# Patient Record
Sex: Male | Born: 1945 | Race: Black or African American | Hispanic: No | Marital: Married | Smoking: Never smoker
Health system: Southern US, Community
[De-identification: ages and names within clinical notes are randomized; demographics above are authoritative.]

## PROBLEM LIST (undated history)

## (undated) DIAGNOSIS — I10 Essential (primary) hypertension: Secondary | ICD-10-CM

## (undated) DIAGNOSIS — I4891 Unspecified atrial fibrillation: Secondary | ICD-10-CM

---

## 2011-05-30 ENCOUNTER — Encounter: Payer: Self-pay | Admitting: *Deleted

## 2011-05-30 ENCOUNTER — Emergency Department (INDEPENDENT_AMBULATORY_CARE_PROVIDER_SITE_OTHER): Payer: Medicare Other

## 2011-05-30 ENCOUNTER — Emergency Department (HOSPITAL_BASED_OUTPATIENT_CLINIC_OR_DEPARTMENT_OTHER)
Admission: EM | Admit: 2011-05-30 | Discharge: 2011-05-30 | Disposition: A | Discharge status: Home / Self Care | Payer: Medicare Other | Attending: Emergency Medicine | Admitting: Emergency Medicine

## 2011-05-30 DIAGNOSIS — J069 Acute upper respiratory infection, unspecified: Secondary | ICD-10-CM

## 2011-05-30 DIAGNOSIS — I4891 Unspecified atrial fibrillation: Secondary | ICD-10-CM | POA: Insufficient documentation

## 2011-05-30 DIAGNOSIS — R059 Cough, unspecified: Secondary | ICD-10-CM

## 2011-05-30 DIAGNOSIS — I10 Essential (primary) hypertension: Secondary | ICD-10-CM | POA: Insufficient documentation

## 2011-05-30 DIAGNOSIS — R05 Cough: Secondary | ICD-10-CM | POA: Insufficient documentation

## 2011-05-30 HISTORY — DX: Unspecified atrial fibrillation: I48.91

## 2011-05-30 HISTORY — DX: Essential (primary) hypertension: I10

## 2011-05-30 MED ORDER — ACETAMINOPHEN-CODEINE 120-12 MG/5ML PO SUSP: 5.0000 mL | Freq: Four times a day (QID) | ORAL | Status: AC | PRN

## 2011-05-30 NOTE — ED Notes (Signed)
Pt states he has had a NP cough for 3 days. Sore throat.

## 2011-05-30 NOTE — ED Provider Notes (Signed)
History  This chart was scribed for Sunnie Nielsen, MD by Bennett Scrape. This patient was seen in room MH01/MH01 and the patient's care was started at 7:34PM.  CSN: [id number] Arrival date & time: 05/30/2011  7:15 PM   First MD Initiated Contact with Patient 05/30/11 1932      Chief Complaint  Patient presents with  . Cough     Patient is a 65 y.o. male presenting with cough. The history is provided by the patient. No language interpreter was used.  Cough The current episode started more than 2 days ago. The problem has been gradually worsening. The cough is non-productive. There has been no fever. Associated symptoms include sore throat. Pertinent negatives include no chest pain, no chills and no shortness of breath. He has tried cough syrup for the symptoms. The treatment provided mild relief. He is not a smoker. His past medical history does not include COPD or asthma.   Jermaine Maldonado is a 65 y.o. male who presents to the Emergency Department complaining of 5 days of a non-productive, gradually worsening cough with associated sore throat. Pt states that he took robutussin with little improvement. Pt denies sick contact at home. Pt denies fever, chills, sinus congestion, abdominal pain, SOB, chest pain and leg pain and swelling as associated symptoms. Pt has a h/o HTN, but denies h/o diabetes, asthma and  COPD.  Pt's PCP is Dr. Alvina Filbert at [facility].  Past Medical History  Diagnosis Date  . Hypertension   . Atrial fibrillation     History reviewed. No pertinent past surgical history.  History reviewed. No pertinent family history.  History  Substance Use Topics  . Smoking status: Never Smoker   . Smokeless tobacco: Not on file  . Alcohol Use: No      Review of Systems  Constitutional: Negative for fever and chills.  HENT: Positive for sore throat. Negative for congestion.   Respiratory: Positive for cough. Negative for shortness of breath.     Cardiovascular: Negative for chest pain and leg swelling.  Gastrointestinal: Negative for abdominal pain.    Allergies  Review of patient's allergies indicates no known allergies.  Home Medications   Current Outpatient Rx  Name Route Sig Dispense Refill  . DILTIAZEM HCL 90 MG PO TABS Oral Take 90 mg by mouth 4 (four) times daily.      Marland Kitchen LISINOPRIL 20 MG PO TABS Oral Take 20 mg by mouth daily.      . WARFARIN SODIUM 5 MG PO TABS Oral Take 7.5-10 mg by mouth daily. Take 1 & 1/2 tabs on Monday, Wednesday, Friday and Sunday. Take 2 tabs on Tuesday, Thursday and Saturday.       Triage Vitals: BP 150/84  Pulse 62  Temp(Src) 98.1 F (36.7 C) (Oral)  Resp 20  Ht 5\' 6"  (1.676 m)  Wt 190 lb (86.183 kg)  BMI 30.67 kg/m2  SpO2 99%  Physical Exam  Nursing note and vitals reviewed. Constitutional: He is oriented to person, place, and time. He appears well-developed and well-nourished.  HENT:  Head: Normocephalic and atraumatic.  Eyes: EOM are normal.  Neck: Neck supple.  Cardiovascular: Normal rate and regular rhythm.   Pulmonary/Chest: Effort normal and breath sounds normal. He has no wheezes. He has no rales.  Abdominal: Soft. There is no tenderness.  Musculoskeletal: Normal range of motion.  Neurological: He is alert and oriented to person, place, and time.  Skin: Skin is warm and dry.  Psychiatric: He  has a normal mood and affect. His behavior is normal.    ED Course  Procedures (including critical care time)  DIAGNOSTIC STUDIES: Oxygen Saturation is 99% on room air, normal by my interpretation.    COORDINATION OF CARE: 7:37PM-Discussed treatment plan with patient at bedside and patient agreed to plan.   Labs Reviewed - No data to display Dg Chest 2 View  05/30/2011  *RADIOLOGY REPORT*  Clinical Data: Cough.  CHEST - 2 VIEW  Comparison: None.  Findings:  Cardiopericardial silhouette within normal limits. Mediastinal contours normal. Trachea midline.  No airspace disease  or effusion.  Bilateral AC joint osteoarthritis.  IMPRESSION: No active cardiopulmonary disease.  Original Report Authenticated By: Andreas Newport, M.D.       MDM   Dry cough and sore throat and clinical presentation consistent with viral URI. Normal pulmonary exam and patient appears well otherwise. Is on lisinopril, and patient aware this could be etiology for cough does not explain associated sore throat. Stable for discharge home with antitussive and plan) care followup for review of medications.   I personally performed the services described in this documentation, which was scribed in my presence. The recorded information has been reviewed and considered.      Sunnie Nielsen, MD 05/30/11 2121

## 2012-12-07 ENCOUNTER — Encounter (HOSPITAL_BASED_OUTPATIENT_CLINIC_OR_DEPARTMENT_OTHER): Payer: Self-pay

## 2012-12-07 ENCOUNTER — Emergency Department (HOSPITAL_BASED_OUTPATIENT_CLINIC_OR_DEPARTMENT_OTHER)
Admission: EM | Admit: 2012-12-07 | Discharge: 2012-12-07 | Disposition: A | Discharge status: Home / Self Care | Payer: Medicare Other | Attending: Emergency Medicine | Admitting: Emergency Medicine

## 2012-12-07 DIAGNOSIS — H539 Unspecified visual disturbance: Secondary | ICD-10-CM | POA: Insufficient documentation

## 2012-12-07 DIAGNOSIS — I4891 Unspecified atrial fibrillation: Secondary | ICD-10-CM | POA: Insufficient documentation

## 2012-12-07 DIAGNOSIS — I10 Essential (primary) hypertension: Secondary | ICD-10-CM | POA: Insufficient documentation

## 2012-12-07 DIAGNOSIS — Z79899 Other long term (current) drug therapy: Secondary | ICD-10-CM | POA: Insufficient documentation

## 2012-12-07 DIAGNOSIS — Z7901 Long term (current) use of anticoagulants: Secondary | ICD-10-CM | POA: Insufficient documentation

## 2012-12-07 MED ORDER — TETRACAINE HCL 0.5 % OP SOLN
OPHTHALMIC | Status: AC
  Filled 2012-12-07: qty 2

## 2012-12-07 MED ORDER — FLUORESCEIN SODIUM 1 MG OP STRP
ORAL_STRIP | OPHTHALMIC | Status: AC
  Filled 2012-12-07: qty 1

## 2012-12-07 NOTE — ED Provider Notes (Signed)
History     CSN: [id number]  Arrival date & time 12/07/12  1024   First MD Initiated Contact with Patient 12/07/12 1044      Chief Complaint  Patient presents with  . Eye Problem    (Consider location/radiation/quality/duration/timing/severity/associated sxs/prior treatment) HPI Complains of floaters, at right I onset last night. No eye pain no foreign body sensation no other complaint nothing makes symptoms but are worse no treatment prior to coming here Past Medical History  Diagnosis Date  . Hypertension   . Atrial fibrillation     History reviewed. No pertinent past surgical history.  No family history on file.  History  Substance Use Topics  . Smoking status: Never Smoker   . Smokeless tobacco: Not on file  . Alcohol Use: No   No tobacco no alcohol no drugs   Review of Systems  Eyes: Positive for visual disturbance. Negative for pain, discharge, redness and itching.  Neurological: Negative.     Allergies  Review of patient's allergies indicates no known allergies.  Home Medications   Current Outpatient Rx  Name  Route  Sig  Dispense  Refill  . diltiazem (CARDIZEM) 90 MG tablet   Oral   Take 90 mg by mouth 4 (four) times daily.           Marland Kitchen lisinopril (PRINIVIL,ZESTRIL) 20 MG tablet   Oral   Take 20 mg by mouth daily.           Marland Kitchen warfarin (COUMADIN) 5 MG tablet   Oral   Take 7.5-10 mg by mouth daily. Take 1 & 1/2 tabs on Monday, Wednesday, Friday and Sunday. Take 2 tabs on Tuesday, Thursday and Saturday.            BP 138/82  Pulse 55  Temp(Src) 98 F (36.7 C) (Oral)  Resp 16  Ht 5\' 7"  (1.702 m)  Wt 185 lb (83.915 kg)  BMI 28.97 kg/m2  SpO2 100%  Physical Exam  Nursing note and vitals reviewed. Constitutional: He is oriented to person, place, and time. He appears well-developed and well-nourished.  HENT:  Head: Normocephalic and atraumatic.  Eyes: Conjunctivae are normal. Pupils are equal, round, and reactive to light. Right eye  exhibits no discharge. Left eye exhibits no discharge. No scleral icterus.  Fundi not well visualized  Cardiovascular: Regular rhythm.   Neurological: He is alert and oriented to person, place, and time. A cranial nerve deficit is present. Coordination normal.   visual acuity noted  ED Course  Procedures (including critical care time)  Labs Reviewed - No data to display No results found.   No diagnosis found.    MDM  I am concerned about possible detached retina Case discussed with Dr.Lyles. An appointment has been scheduled at his office for 1:15 PM today Diagnosis scotomata of right eye        Doug Sou, MD 12/07/12 1104

## 2012-12-07 NOTE — ED Notes (Signed)
Pt reports he feels like something is in his right eye.  Denies injury and onset was last pm.

## 2013-06-08 ENCOUNTER — Other Ambulatory Visit (HOSPITAL_COMMUNITY): Payer: Self-pay | Admitting: Sports Medicine

## 2013-06-08 DIAGNOSIS — M5126 Other intervertebral disc displacement, lumbar region: Secondary | ICD-10-CM

## 2013-06-10 ENCOUNTER — Encounter (HOSPITAL_BASED_OUTPATIENT_CLINIC_OR_DEPARTMENT_OTHER): Payer: Self-pay | Admitting: Emergency Medicine

## 2013-06-10 ENCOUNTER — Emergency Department (HOSPITAL_BASED_OUTPATIENT_CLINIC_OR_DEPARTMENT_OTHER)
Admission: EM | Admit: 2013-06-10 | Discharge: 2013-06-10 | Disposition: A | Discharge status: Home / Self Care | Payer: Managed Care, Other (non HMO) | Attending: Emergency Medicine | Admitting: Emergency Medicine

## 2013-06-10 DIAGNOSIS — Z7901 Long term (current) use of anticoagulants: Secondary | ICD-10-CM | POA: Insufficient documentation

## 2013-06-10 DIAGNOSIS — K529 Noninfective gastroenteritis and colitis, unspecified: Secondary | ICD-10-CM

## 2013-06-10 DIAGNOSIS — Z79899 Other long term (current) drug therapy: Secondary | ICD-10-CM | POA: Insufficient documentation

## 2013-06-10 DIAGNOSIS — K5289 Other specified noninfective gastroenteritis and colitis: Secondary | ICD-10-CM | POA: Insufficient documentation

## 2013-06-10 DIAGNOSIS — I4891 Unspecified atrial fibrillation: Secondary | ICD-10-CM | POA: Insufficient documentation

## 2013-06-10 DIAGNOSIS — I10 Essential (primary) hypertension: Secondary | ICD-10-CM | POA: Insufficient documentation

## 2013-06-10 DIAGNOSIS — R6883 Chills (without fever): Secondary | ICD-10-CM | POA: Insufficient documentation

## 2013-06-10 LAB — CBC WITH DIFFERENTIAL/PLATELET
Eosinophils Relative: 1 % (ref 0–5)
HCT: 46.4 % (ref 39.0–52.0)
Hemoglobin: 15.6 g/dL (ref 13.0–17.0)
Lymphocytes Relative: 4 % — ABNORMAL LOW (ref 12–46)
Lymphs Abs: 0.3 10*3/uL — ABNORMAL LOW (ref 0.7–4.0)
MCV: 82 fL (ref 78.0–100.0)
Monocytes Absolute: 0.4 10*3/uL (ref 0.1–1.0)
Neutro Abs: 6.4 10*3/uL (ref 1.7–7.7)
RBC: 5.66 MIL/uL (ref 4.22–5.81)
WBC: 7.2 10*3/uL (ref 4.0–10.5)

## 2013-06-10 LAB — COMPREHENSIVE METABOLIC PANEL
ALT: 35 U/L (ref 0–53)
CO2: 25 mEq/L (ref 19–32)
Calcium: 10 mg/dL (ref 8.4–10.5)
Chloride: 100 mEq/L (ref 96–112)
Creatinine, Ser: 1.3 mg/dL (ref 0.50–1.35)
GFR calc Af Amer: 64 mL/min — ABNORMAL LOW (ref >90)
GFR calc non Af Amer: 55 mL/min — ABNORMAL LOW (ref >90)
Glucose, Bld: 114 mg/dL — ABNORMAL HIGH (ref 70–99)
Total Bilirubin: 0.4 mg/dL (ref 0.3–1.2)

## 2013-06-10 MED ORDER — SODIUM CHLORIDE 0.9 % IV SOLN
Freq: Once | INTRAVENOUS | Status: AC
  Administered 2013-06-10: 12:00:00 via INTRAVENOUS

## 2013-06-10 MED ORDER — PROMETHAZINE HCL 25 MG PO TABS: 25.0000 mg | ORAL_TABLET | Freq: Four times a day (QID) | ORAL | Status: AC | PRN

## 2013-06-10 MED ORDER — ONDANSETRON HCL 4 MG/2ML IJ SOLN
4.0000 mg | Freq: Once | INTRAMUSCULAR | Status: AC
  Administered 2013-06-10: 4 mg via INTRAVENOUS
  Filled 2013-06-10: qty 2

## 2013-06-10 NOTE — ED Notes (Signed)
Patient here for evaluation following abdominal cramping with vomiting and diarrhea that started last pm at 2300. Patient reports that he has vomited 6 times and multiple episodes of diarrhea; no vomiting x 2 hours. Denies any abdominal pain.

## 2013-06-10 NOTE — Discharge Instructions (Signed)
Phenergan as prescribed as needed for nausea.  Return to the emergency department if you develop severe abdominal pain, bloody stool, or no urine output in 12 hours.   Viral Gastroenteritis Viral gastroenteritis is also known as stomach flu. This condition affects the stomach and intestinal tract. It can cause sudden diarrhea and vomiting. The illness typically lasts 3 to 8 days. Most people develop an immune response that eventually gets rid of the virus. While this natural response develops, the virus can make you quite ill. CAUSES  Many different viruses can cause gastroenteritis, such as rotavirus or noroviruses. You can catch one of these viruses by consuming contaminated food or water. You may also catch a virus by sharing utensils or other personal items with an infected person or by touching a contaminated surface. SYMPTOMS  The most common symptoms are diarrhea and vomiting. These problems can cause a severe loss of body fluids (dehydration) and a body salt (electrolyte) imbalance. Other symptoms may include:  Fever.  Headache.  Fatigue.  Abdominal pain. DIAGNOSIS  Your caregiver can usually diagnose viral gastroenteritis based on your symptoms and a physical exam. A stool sample may also be taken to test for the presence of viruses or other infections. TREATMENT  This illness typically goes away on its own. Treatments are aimed at rehydration. The most serious cases of viral gastroenteritis involve vomiting so severely that you are not able to keep fluids down. In these cases, fluids must be given through an intravenous line (IV). HOME CARE INSTRUCTIONS   Drink enough fluids to keep your urine clear or pale yellow. Drink small amounts of fluids frequently and increase the amounts as tolerated.  Ask your caregiver for specific rehydration instructions.  Avoid:  Foods high in sugar.  Alcohol.  Carbonated drinks.  Tobacco.  Juice.  Caffeine drinks.  Extremely hot or  cold fluids.  Fatty, greasy foods.  Too much intake of anything at one time.  Dairy products until 24 to 48 hours after diarrhea stops.  You may consume probiotics. Probiotics are active cultures of beneficial bacteria. They may lessen the amount and number of diarrheal stools in adults. Probiotics can be found in yogurt with active cultures and in supplements.  Wash your hands well to avoid spreading the virus.  Only take over-the-counter or prescription medicines for pain, discomfort, or fever as directed by your caregiver. Do not give aspirin to children. Antidiarrheal medicines are not recommended.  Ask your caregiver if you should continue to take your regular prescribed and over-the-counter medicines.  Keep all follow-up appointments as directed by your caregiver. SEEK IMMEDIATE MEDICAL CARE IF:   You are unable to keep fluids down.  You do not urinate at least once every 6 to 8 hours.  You develop shortness of breath.  You notice blood in your stool or vomit. This may look like coffee grounds.  You have abdominal pain that increases or is concentrated in one small area (localized).  You have persistent vomiting or diarrhea.  You have a fever.  The patient is a child younger than 3 months, and he or she has a fever.  The patient is a child older than 3 months, and he or she has a fever and persistent symptoms.  The patient is a child older than 3 months, and he or she has a fever and symptoms suddenly get worse.  The patient is a baby, and he or she has no tears when crying. MAKE SURE YOU:   Understand these  instructions.  Will watch your condition.  Will get help right away if you are not doing well or get worse. Document Released: 06/21/2005 Document Revised: 09/13/2011 Document Reviewed: 04/07/2011 [facility] Patient Information 2014 [place].

## 2013-06-10 NOTE — ED Provider Notes (Signed)
CSN: [id number]     Arrival date & time 06/10/13  1034 History   First MD Initiated Contact with Patient 06/10/13 1118     Chief Complaint  Patient presents with  . Emesis   (Consider location/radiation/quality/duration/timing/severity/associated sxs/prior Treatment) HPI Comments: Patient is a 67 year old male history of atrial fibrillation. He presents with a 12 hour history of nausea vomiting and diarrhea. All of this has been nonbloody. He has felt chilled but denies having taken his temperature. He denies any ill contacts.  Patient is a 67 y.o. male presenting with vomiting. The history is provided by the patient.  Emesis Severity:  Moderate Duration:  12 hours Timing:  Constant Progression:  Worsening Chronicity:  New Relieved by:  Nothing Worsened by:  Nothing tried Ineffective treatments:  None tried Associated symptoms: chills and diarrhea   Associated symptoms: no abdominal pain and no fever     Past Medical History  Diagnosis Date  . Hypertension   . Atrial fibrillation    History reviewed. No pertinent past surgical history. No family history on file. History  Substance Use Topics  . Smoking status: Never Smoker   . Smokeless tobacco: Not on file  . Alcohol Use: No    Review of Systems  Constitutional: Positive for chills.  Gastrointestinal: Positive for vomiting and diarrhea. Negative for abdominal pain.  All other systems reviewed and are negative.    Allergies  Review of patient's allergies indicates no known allergies.  Home Medications   Current Outpatient Rx  Name  Route  Sig  Dispense  Refill  . diltiazem (CARDIZEM) 90 MG tablet   Oral   Take 90 mg by mouth 4 (four) times daily.           Marland Kitchen lisinopril (PRINIVIL,ZESTRIL) 20 MG tablet   Oral   Take 20 mg by mouth daily.           Marland Kitchen warfarin (COUMADIN) 5 MG tablet   Oral   Take 7.5-10 mg by mouth daily. Take 1 & 1/2 tabs on Monday, Wednesday, Friday and Sunday. Take 2 tabs on Tuesday,  Thursday and Saturday.           BP 116/77  Pulse 114  Temp(Src) 98.7 F (37.1 C) (Oral)  Resp 18  SpO2 100% Physical Exam  Nursing note and vitals reviewed. Constitutional: He is oriented to person, place, and time. He appears well-developed and well-nourished. No distress.  HENT:  Head: Normocephalic and atraumatic.  Mouth/Throat: Oropharynx is clear and moist.  Neck: Normal range of motion. Neck supple.  Cardiovascular: Normal rate, regular rhythm and normal heart sounds.   No murmur heard. Pulmonary/Chest: Effort normal and breath sounds normal. No respiratory distress. He has no wheezes.  Abdominal: Soft. Bowel sounds are normal. He exhibits no distension. There is no tenderness.  Musculoskeletal: Normal range of motion. He exhibits no edema.  Lymphadenopathy:    He has no cervical adenopathy.  Neurological: He is alert and oriented to person, place, and time.  Skin: Skin is warm and dry. No rash noted. He is not diaphoretic.    ED Course  Procedures (including critical care time) Labs Review Labs Reviewed  CBC WITH DIFFERENTIAL  COMPREHENSIVE METABOLIC PANEL   Imaging Review No results found.    MDM  No diagnosis found. Patient is a 67 year old male presents with complaints of nausea vomiting and diarrhea. His presentation, exam, and workup are all consistent with a viral gastroenteritis. He was given IV fluids and Zofran and is  now feeling better. His laboratory studies are unremarkable and I feel as though he is stable for discharge with when necessary followup.    Geoffery Lyons, MD 06/10/13 1331

## 2013-06-18 ENCOUNTER — Ambulatory Visit (HOSPITAL_COMMUNITY)
Admission: RE | Admit: 2013-06-18 | Discharge: 2013-06-18 | Disposition: A | Discharge status: Home / Self Care | Payer: Managed Care, Other (non HMO) | Source: Ambulatory Visit | Attending: Sports Medicine | Admitting: Sports Medicine

## 2013-06-18 DIAGNOSIS — M545 Low back pain, unspecified: Secondary | ICD-10-CM | POA: Insufficient documentation

## 2013-06-18 DIAGNOSIS — M47817 Spondylosis without myelopathy or radiculopathy, lumbosacral region: Secondary | ICD-10-CM | POA: Insufficient documentation

## 2013-06-18 DIAGNOSIS — Q762 Congenital spondylolisthesis: Secondary | ICD-10-CM | POA: Insufficient documentation

## 2013-06-18 DIAGNOSIS — M5126 Other intervertebral disc displacement, lumbar region: Secondary | ICD-10-CM | POA: Insufficient documentation

## 2013-10-17 ENCOUNTER — Emergency Department (HOSPITAL_BASED_OUTPATIENT_CLINIC_OR_DEPARTMENT_OTHER)
Admission: EM | Admit: 2013-10-17 | Discharge: 2013-10-17 | Disposition: A | Discharge status: Home / Self Care | Payer: Managed Care, Other (non HMO) | Attending: Emergency Medicine | Admitting: Emergency Medicine

## 2013-10-17 ENCOUNTER — Emergency Department (HOSPITAL_BASED_OUTPATIENT_CLINIC_OR_DEPARTMENT_OTHER): Payer: Managed Care, Other (non HMO)

## 2013-10-17 ENCOUNTER — Encounter (HOSPITAL_BASED_OUTPATIENT_CLINIC_OR_DEPARTMENT_OTHER): Payer: Self-pay | Admitting: Emergency Medicine

## 2013-10-17 DIAGNOSIS — X500XXA Overexertion from strenuous movement or load, initial encounter: Secondary | ICD-10-CM | POA: Insufficient documentation

## 2013-10-17 DIAGNOSIS — S39012A Strain of muscle, fascia and tendon of lower back, initial encounter: Secondary | ICD-10-CM

## 2013-10-17 DIAGNOSIS — S335XXA Sprain of ligaments of lumbar spine, initial encounter: Secondary | ICD-10-CM | POA: Insufficient documentation

## 2013-10-17 DIAGNOSIS — Z7901 Long term (current) use of anticoagulants: Secondary | ICD-10-CM | POA: Insufficient documentation

## 2013-10-17 DIAGNOSIS — I4891 Unspecified atrial fibrillation: Secondary | ICD-10-CM | POA: Insufficient documentation

## 2013-10-17 DIAGNOSIS — I10 Essential (primary) hypertension: Secondary | ICD-10-CM | POA: Insufficient documentation

## 2013-10-17 DIAGNOSIS — Y9289 Other specified places as the place of occurrence of the external cause: Secondary | ICD-10-CM | POA: Insufficient documentation

## 2013-10-17 DIAGNOSIS — Z79899 Other long term (current) drug therapy: Secondary | ICD-10-CM | POA: Insufficient documentation

## 2013-10-17 DIAGNOSIS — Y93H2 Activity, gardening and landscaping: Secondary | ICD-10-CM | POA: Insufficient documentation

## 2013-10-17 LAB — URINALYSIS, ROUTINE W REFLEX MICROSCOPIC
Bilirubin Urine: NEGATIVE
GLUCOSE, UA: NEGATIVE mg/dL
Hgb urine dipstick: NEGATIVE
KETONES UR: NEGATIVE mg/dL
LEUKOCYTES UA: NEGATIVE
Nitrite: NEGATIVE
PH: 6 (ref 5.0–8.0)
Protein, ur: NEGATIVE mg/dL
Specific Gravity, Urine: 1.021 (ref 1.005–1.030)
Urobilinogen, UA: 0.2 mg/dL (ref 0.0–1.0)

## 2013-10-17 MED ORDER — OXYCODONE-ACETAMINOPHEN 5-325 MG PO TABS
2.0000 | ORAL_TABLET | Freq: Once | ORAL | Status: AC
  Administered 2013-10-17: 2 via ORAL
  Filled 2013-10-17: qty 2

## 2013-10-17 MED ORDER — OXYCODONE-ACETAMINOPHEN 5-325 MG PO TABS: 2.0000 | ORAL_TABLET | ORAL | Status: AC | PRN

## 2013-10-17 NOTE — ED Provider Notes (Signed)
CSN: [id number]     Arrival date & time 10/17/13  1157 History   First MD Initiated Contact with Patient 10/17/13 1205     Chief Complaint  Patient presents with  . Back Pain     (Consider location/radiation/quality/duration/timing/severity/associated sxs/prior Treatment) Patient is a 68 y.o. male presenting with back pain. The history is provided by the patient. No language interpreter was used.  Back Pain Location:  Generalized Quality:  Aching Radiates to:  Does not radiate Pain severity:  Moderate Pain is:  Same all the time Onset quality:  Gradual Timing:  Constant Progression:  Worsening Chronicity:  New Context: lifting heavy objects   Relieved by:  Nothing Worsened by:  Nothing tried Ineffective treatments:  None tried Associated symptoms: no abdominal pain     Past Medical History  Diagnosis Date  . Hypertension   . Atrial fibrillation    History reviewed. No pertinent past surgical history. No family history on file. History  Substance Use Topics  . Smoking status: Never Smoker   . Smokeless tobacco: Not on file  . Alcohol Use: No    Review of Systems  Gastrointestinal: Negative for abdominal pain.  Musculoskeletal: Positive for back pain.  All other systems reviewed and are negative.     Allergies  Review of patient's allergies indicates no known allergies.  Home Medications   Prior to Admission medications   Medication Sig Start Date End Date Taking? Authorizing Provider  HYDRALAZINE-HCTZ PO Take by mouth.   Yes Historical Provider, MD  diltiazem (CARDIZEM) 90 MG tablet Take 90 mg by mouth 4 (four) times daily.      Historical Provider, MD  lisinopril (PRINIVIL,ZESTRIL) 20 MG tablet Take 20 mg by mouth daily.      Historical Provider, MD  promethazine (PHENERGAN) 25 MG tablet Take 1 tablet (25 mg total) by mouth every 6 (six) hours as needed for nausea. 06/10/13   Geoffery Lyonsouglas Delo, MD  warfarin (COUMADIN) 5 MG tablet Take 7.5-10 mg by mouth daily.  Take 1 & 1/2 tabs on Monday, Wednesday, Friday and Sunday. Take 2 tabs on Tuesday, Thursday and Saturday.     Historical Provider, MD   BP 160/75  Pulse 59  Temp(Src) 98.3 F (36.8 C) (Oral)  Resp 16  Ht 5\' 6"  (1.676 m)  Wt 190 lb (86.183 kg)  BMI 30.68 kg/m2  SpO2 96% Physical Exam  Nursing note and vitals reviewed. Constitutional: He is oriented to person, place, and time. He appears well-developed and well-nourished.  HENT:  Head: Normocephalic.  Right Ear: External ear normal.  Left Ear: External ear normal.  Nose: Nose normal.  Mouth/Throat: Oropharynx is clear and moist.  Eyes: EOM are normal. Pupils are equal, round, and reactive to light.  Neck: Normal range of motion.  Cardiovascular: Normal rate and normal heart sounds.   Pulmonary/Chest: Effort normal.  Abdominal: Soft. He exhibits no distension.  Musculoskeletal: Normal range of motion.  Neurological: He is alert and oriented to person, place, and time.  Skin: Skin is warm.  Psychiatric: He has a normal mood and affect.    ED Course  Procedures (including critical care time) Labs Review Labs Reviewed - No data to display  Imaging Review No results found.   EKG Interpretation None      MDM   Final diagnoses:  Lumbar strain    Pt given Percocet for pain     Elson AreasLeslie K Kasara Schomer, PA-C 10/17/13 1352

## 2013-10-17 NOTE — ED Provider Notes (Signed)
Medical screening examination/treatment/procedure(s) were conducted as a shared visit with non-physician practitioner(s) and myself.  I personally evaluated the patient during the encounter.   EKG Interpretation None        Joya Gaskinsonald W Edgard Debord, MD 10/17/13 1416

## 2013-10-17 NOTE — Discharge Instructions (Signed)
Back Pain, Adult Low back pain is very common. About 1 in 5 people have back pain.The cause of low back pain is rarely dangerous. The pain often gets better over time.About half of people with a sudden onset of back pain feel better in just 2 weeks. About 8 in 10 people feel better by 6 weeks.  CAUSES Some common causes of back pain include:  Strain of the muscles or ligaments supporting the spine.  Wear and tear (degeneration) of the spinal discs.  Arthritis.  Direct injury to the back. DIAGNOSIS Most of the time, the direct cause of low back pain is not known.However, back pain can be treated effectively even when the exact cause of the pain is unknown.Answering your caregiver's questions about your overall health and symptoms is one of the most accurate ways to make sure the cause of your pain is not dangerous. If your caregiver needs more information, he or she may order lab work or imaging tests (X-rays or MRIs).However, even if imaging tests show changes in your back, this usually does not require surgery. HOME CARE INSTRUCTIONS For many people, back pain returns.Since low back pain is rarely dangerous, it is often a condition that people can learn to manageon their own.   Remain active. It is stressful on the back to sit or stand in one place. Do not sit, drive, or stand in one place for more than 30 minutes at a time. Take short walks on level surfaces as soon as pain allows.Try to increase the length of time you walk each day.  Do not stay in bed.Resting more than 1 or 2 days can delay your recovery.  Do not avoid exercise or work.Your body is made to move.It is not dangerous to be active, even though your back may hurt.Your back will likely heal faster if you return to being active before your pain is gone.  Pay attention to your body when you bend and lift. Many people have less discomfortwhen lifting if they bend their knees, keep the load close to their bodies,and  avoid twisting. Often, the most comfortable positions are those that put less stress on your recovering back.  Find a comfortable position to sleep. Use a firm mattress and lie on your side with your knees slightly bent. If you lie on your back, put a pillow under your knees.  Only take over-the-counter or prescription medicines as directed by your caregiver. Over-the-counter medicines to reduce pain and inflammation are often the most helpful.Your caregiver may prescribe muscle relaxant drugs.These medicines help dull your pain so you can more quickly return to your normal activities and healthy exercise.  Put ice on the injured area.  Put ice in a plastic bag.  Place a towel between your skin and the bag.  Leave the ice on for 15-20 minutes, 03-04 times a day for the first 2 to 3 days. After that, ice and heat may be alternated to reduce pain and spasms.  Ask your caregiver about trying back exercises and gentle massage. This may be of some benefit.  Avoid feeling anxious or stressed.Stress increases muscle tension and can worsen back pain.It is important to recognize when you are anxious or stressed and learn ways to manage it.Exercise is a great option. SEEK MEDICAL CARE IF:  You have pain that is not relieved with rest or medicine.  You have pain that does not improve in 1 week.  You have new symptoms.  You are generally not feeling well. SEEK   IMMEDIATE MEDICAL CARE IF:   You have pain that radiates from your back into your legs.  You develop new bowel or bladder control problems.  You have unusual weakness or numbness in your arms or legs.  You develop nausea or vomiting.  You develop abdominal pain.  You feel faint. Document Released: 06/21/2005 Document Revised: 12/21/2011 Document Reviewed: 11/09/2010 [employer] Patient Information 2014 [employer].  

## 2013-10-17 NOTE — ED Provider Notes (Signed)
Patient seen/examined in the Emergency Department in conjunction with Midlevel Provider FeltonSofia Patient reports back pain after using lawn mower yesterday Exam : awake/alert, no distress, no focal motor deficits in the lower extremites Plan: stable for d/c home   Jermaine Gaskinsonald W Avryl Roehm, MD 10/17/13 1352

## 2013-10-17 NOTE — ED Notes (Signed)
C/o left lower back pain started yesterday after cranking lawn mower-pain worse with movement-slow steady gait from ED WR to tx area

## 2013-12-25 ENCOUNTER — Ambulatory Visit: Payer: Managed Care, Other (non HMO) | Attending: Family Medicine | Admitting: Physical Therapy

## 2013-12-25 DIAGNOSIS — M545 Low back pain, unspecified: Secondary | ICD-10-CM | POA: Diagnosis not present

## 2013-12-25 DIAGNOSIS — IMO0001 Reserved for inherently not codable concepts without codable children: Secondary | ICD-10-CM | POA: Diagnosis not present

## 2013-12-27 ENCOUNTER — Ambulatory Visit: Payer: Managed Care, Other (non HMO) | Admitting: Physical Therapy

## 2014-01-01 ENCOUNTER — Ambulatory Visit: Payer: Managed Care, Other (non HMO) | Admitting: Physical Therapy

## 2014-01-01 DIAGNOSIS — IMO0001 Reserved for inherently not codable concepts without codable children: Secondary | ICD-10-CM | POA: Diagnosis not present

## 2014-01-03 ENCOUNTER — Ambulatory Visit: Payer: Managed Care, Other (non HMO) | Attending: Family Medicine | Admitting: Physical Therapy

## 2014-01-03 DIAGNOSIS — M545 Low back pain, unspecified: Secondary | ICD-10-CM | POA: Insufficient documentation

## 2014-01-03 DIAGNOSIS — IMO0001 Reserved for inherently not codable concepts without codable children: Secondary | ICD-10-CM | POA: Insufficient documentation

## 2014-01-08 ENCOUNTER — Ambulatory Visit: Payer: Managed Care, Other (non HMO) | Admitting: Physical Therapy

## 2014-01-10 ENCOUNTER — Ambulatory Visit: Payer: Managed Care, Other (non HMO) | Admitting: Physical Therapy

## 2014-01-14 ENCOUNTER — Ambulatory Visit: Payer: Managed Care, Other (non HMO) | Admitting: Physical Therapy

## 2014-01-16 ENCOUNTER — Ambulatory Visit: Payer: Managed Care, Other (non HMO) | Admitting: Physical Therapy

## 2014-08-20 ENCOUNTER — Emergency Department (HOSPITAL_BASED_OUTPATIENT_CLINIC_OR_DEPARTMENT_OTHER)
Admission: EM | Admit: 2014-08-20 | Discharge: 2014-08-21 | Disposition: A | Discharge status: Home / Self Care | Payer: Managed Care, Other (non HMO) | Attending: Emergency Medicine | Admitting: Emergency Medicine

## 2014-08-20 ENCOUNTER — Encounter (HOSPITAL_BASED_OUTPATIENT_CLINIC_OR_DEPARTMENT_OTHER): Payer: Self-pay | Admitting: Emergency Medicine

## 2014-08-20 ENCOUNTER — Emergency Department (HOSPITAL_BASED_OUTPATIENT_CLINIC_OR_DEPARTMENT_OTHER): Payer: Managed Care, Other (non HMO)

## 2014-08-20 DIAGNOSIS — Z79899 Other long term (current) drug therapy: Secondary | ICD-10-CM | POA: Insufficient documentation

## 2014-08-20 DIAGNOSIS — Y9389 Activity, other specified: Secondary | ICD-10-CM | POA: Diagnosis not present

## 2014-08-20 DIAGNOSIS — Y998 Other external cause status: Secondary | ICD-10-CM | POA: Diagnosis not present

## 2014-08-20 DIAGNOSIS — I10 Essential (primary) hypertension: Secondary | ICD-10-CM | POA: Diagnosis not present

## 2014-08-20 DIAGNOSIS — S161XXA Strain of muscle, fascia and tendon at neck level, initial encounter: Secondary | ICD-10-CM

## 2014-08-20 DIAGNOSIS — Z7901 Long term (current) use of anticoagulants: Secondary | ICD-10-CM | POA: Diagnosis not present

## 2014-08-20 DIAGNOSIS — Y92410 Unspecified street and highway as the place of occurrence of the external cause: Secondary | ICD-10-CM | POA: Insufficient documentation

## 2014-08-20 DIAGNOSIS — M542 Cervicalgia: Secondary | ICD-10-CM

## 2014-08-20 DIAGNOSIS — I4891 Unspecified atrial fibrillation: Secondary | ICD-10-CM | POA: Insufficient documentation

## 2014-08-20 DIAGNOSIS — S199XXA Unspecified injury of neck, initial encounter: Secondary | ICD-10-CM | POA: Diagnosis present

## 2014-08-20 MED ORDER — IBUPROFEN 800 MG PO TABS
800.0000 mg | ORAL_TABLET | Freq: Once | ORAL | Status: AC
  Administered 2014-08-21: 800 mg via ORAL
  Filled 2014-08-20: qty 1

## 2014-08-20 MED ORDER — METHOCARBAMOL 500 MG PO TABS
1000.0000 mg | ORAL_TABLET | Freq: Once | ORAL | Status: AC
  Administered 2014-08-21: 1000 mg via ORAL
  Filled 2014-08-20: qty 2
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
## 2014-08-21 ENCOUNTER — Encounter (HOSPITAL_BASED_OUTPATIENT_CLINIC_OR_DEPARTMENT_OTHER): Payer: Self-pay | Admitting: Emergency Medicine

## 2014-08-21 DIAGNOSIS — S161XXA Strain of muscle, fascia and tendon at neck level, initial encounter: Secondary | ICD-10-CM | POA: Diagnosis not present

## 2014-08-21 MED ORDER — METHOCARBAMOL 500 MG PO TABS: 500.0000 mg | ORAL_TABLET | Freq: Two times a day (BID) | ORAL | Status: AC

## 2014-08-21 MED ORDER — NAPROXEN 375 MG PO TABS: 375.0000 mg | ORAL_TABLET | Freq: Two times a day (BID) | ORAL | Status: AC

## 2014-08-21 NOTE — Discharge Instructions (Signed)
Cervical Strain and Sprain (Whiplash) with Rehab Cervical strain and sprain are injuries that commonly occur with "whiplash" injuries. Whiplash occurs when the neck is forcefully whipped backward or forward, such as during a motor vehicle accident or during contact sports. The muscles, ligaments, tendons, discs, and nerves of the neck are susceptible to injury when this occurs. RISK FACTORS Risk of having a whiplash injury increases if:  Osteoarthritis of the spine.  Situations that make head or neck accidents or trauma more likely.  High-risk sports (football, rugby, wrestling, hockey, auto racing, gymnastics, diving, contact karate, or boxing).  Poor strength and flexibility of the neck.  Previous neck injury.  Poor tackling technique.  Improperly fitted or padded equipment. SYMPTOMS   Pain or stiffness in the front or back of neck or both.  Symptoms may present immediately or up to 24 hours after injury.  Dizziness, headache, nausea, and vomiting.  Muscle spasm with soreness and stiffness in the neck.  Tenderness and swelling at the injury site. PREVENTION  Learn and use proper technique (avoid tackling with the head, spearing, and head-butting; use proper falling techniques to avoid landing on the head).  Warm up and stretch properly before activity.  Maintain physical fitness:  Strength, flexibility, and endurance.  Cardiovascular fitness.  Wear properly fitted and padded protective equipment, such as padded soft collars, for participation in contact sports. PROGNOSIS  Recovery from cervical strain and sprain injuries is dependent on the extent of the injury. These injuries are usually curable in 1 week to 3 months with appropriate treatment.  RELATED COMPLICATIONS   Temporary numbness and weakness may occur if the nerve roots are damaged, and this may persist until the nerve has completely healed.  Chronic pain due to frequent recurrence of  symptoms.  Prolonged healing, especially if activity is resumed too soon (before complete recovery). TREATMENT  Treatment initially involves the use of ice and medication to help reduce pain and inflammation. It is also important to perform strengthening and stretching exercises and modify activities that worsen symptoms so the injury does not get worse. These exercises may be performed at home or with a therapist. For patients who experience severe symptoms, a soft, padded collar may be recommended to be worn around the neck.  Improving your posture may help reduce symptoms. Posture improvement includes pulling your chin and abdomen in while sitting or standing. If you are sitting, sit in a firm chair with your buttocks against the back of the chair. While sleeping, try replacing your pillow with a small towel rolled to 2 inches in diameter, or use a cervical pillow or soft cervical collar. Poor sleeping positions delay healing.  For patients with nerve root damage, which causes numbness or weakness, the use of a cervical traction apparatus may be recommended. Surgery is rarely necessary for these injuries. However, cervical strain and sprains that are present at birth (congenital) may require surgery. MEDICATION   If pain medication is necessary, nonsteroidal anti-inflammatory medications, such as aspirin and ibuprofen, or other minor pain relievers, such as acetaminophen, are often recommended.  Do not take pain medication for 7 days before surgery.  Prescription pain relievers may be given if deemed necessary by your caregiver. Use only as directed and only as much as you need. HEAT AND COLD:   Cold treatment (icing) relieves pain and reduces inflammation. Cold treatment should be applied for 10 to 15 minutes every 2 to 3 hours for inflammation and pain and immediately after any activity that aggravates   your symptoms. Use ice packs or an ice massage.  Heat treatment may be used prior to  performing the stretching and strengthening activities prescribed by your caregiver, physical therapist, or athletic trainer. Use a heat pack or a warm soak. SEEK MEDICAL CARE IF:   Symptoms get worse or do not improve in 2 weeks despite treatment.  New, unexplained symptoms develop (drugs used in treatment may produce side effects). EXERCISES RANGE OF MOTION (ROM) AND STRETCHING EXERCISES - Cervical Strain and Sprain These exercises may help you when beginning to rehabilitate your injury. In order to successfully resolve your symptoms, you must improve your posture. These exercises are designed to help reduce the forward-head and rounded-shoulder posture which contributes to this condition. Your symptoms may resolve with or without further involvement from your physician, physical therapist or athletic trainer. While completing these exercises, remember:   Restoring tissue flexibility helps normal motion to return to the joints. This allows healthier, less painful movement and activity.  An effective stretch should be held for at least 20 seconds, although you may need to begin with shorter hold times for comfort.  A stretch should never be painful. You should only feel a gentle lengthening or release in the stretched tissue. STRETCH- Axial Extensors  Lie on your back on the floor. You may bend your knees for comfort. Place a rolled-up hand towel or dish towel, about 2 inches in diameter, under the part of your head that makes contact with the floor.  Gently tuck your chin, as if trying to make a "double chin," until you feel a gentle stretch at the base of your head.  Hold __________ seconds. Repeat __________ times. Complete this exercise __________ times per day.  STRETCH - Axial Extension   Stand or sit on a firm surface. Assume a good posture: chest up, shoulders drawn back, abdominal muscles slightly tense, knees unlocked (if standing) and feet hip width apart.  Slowly retract your  chin so your head slides back and your chin slightly lowers. Continue to look straight ahead.  You should feel a gentle stretch in the back of your head. Be certain not to feel an aggressive stretch since this can cause headaches later.  Hold for __________ seconds. Repeat __________ times. Complete this exercise __________ times per day. STRETCH - Cervical Side Bend   Stand or sit on a firm surface. Assume a good posture: chest up, shoulders drawn back, abdominal muscles slightly tense, knees unlocked (if standing) and feet hip width apart.  Without letting your nose or shoulders move, slowly tip your right / left ear to your shoulder until your feel a gentle stretch in the muscles on the opposite side of your neck.  Hold __________ seconds. Repeat __________ times. Complete this exercise __________ times per day. STRETCH - Cervical Rotators   Stand or sit on a firm surface. Assume a good posture: chest up, shoulders drawn back, abdominal muscles slightly tense, knees unlocked (if standing) and feet hip width apart.  Keeping your eyes level with the ground, slowly turn your head until you feel a gentle stretch along the back and opposite side of your neck.  Hold __________ seconds. Repeat __________ times. Complete this exercise __________ times per day. RANGE OF MOTION - Neck Circles   Stand or sit on a firm surface. Assume a good posture: chest up, shoulders drawn back, abdominal muscles slightly tense, knees unlocked (if standing) and feet hip width apart.  Gently roll your head down and around from the   back of one shoulder to the back of the other. The motion should never be forced or painful.  Repeat the motion 10-20 times, or until you feel the neck muscles relax and loosen. Repeat __________ times. Complete the exercise __________ times per day. STRENGTHENING EXERCISES - Cervical Strain and Sprain These exercises may help you when beginning to rehabilitate your injury. They may  resolve your symptoms with or without further involvement from your physician, physical therapist, or athletic trainer. While completing these exercises, remember:   Muscles can gain both the endurance and the strength needed for everyday activities through controlled exercises.  Complete these exercises as instructed by your physician, physical therapist, or athletic trainer. Progress the resistance and repetitions only as guided.  You may experience muscle soreness or fatigue, but the pain or discomfort you are trying to eliminate should never worsen during these exercises. If this pain does worsen, stop and make certain you are following the directions exactly. If the pain is still present after adjustments, discontinue the exercise until you can discuss the trouble with your clinician. STRENGTH - Cervical Flexors, Isometric  Face a wall, standing about 6 inches away. Place a small pillow, a ball about 6-8 inches in diameter, or a folded towel between your forehead and the wall.  Slightly tuck your chin and gently push your forehead into the soft object. Push only with mild to moderate intensity, building up tension gradually. Keep your jaw and forehead relaxed.  Hold 10 to 20 seconds. Keep your breathing relaxed.  Release the tension slowly. Relax your neck muscles completely before you start the next repetition. Repeat __________ times. Complete this exercise __________ times per day. STRENGTH- Cervical Lateral Flexors, Isometric   Stand about 6 inches away from a wall. Place a small pillow, a ball about 6-8 inches in diameter, or a folded towel between the side of your head and the wall.  Slightly tuck your chin and gently tilt your head into the soft object. Push only with mild to moderate intensity, building up tension gradually. Keep your jaw and forehead relaxed.  Hold 10 to 20 seconds. Keep your breathing relaxed.  Release the tension slowly. Relax your neck muscles completely  before you start the next repetition. Repeat __________ times. Complete this exercise __________ times per day. STRENGTH - Cervical Extensors, Isometric   Stand about 6 inches away from a wall. Place a small pillow, a ball about 6-8 inches in diameter, or a folded towel between the back of your head and the wall.  Slightly tuck your chin and gently tilt your head back into the soft object. Push only with mild to moderate intensity, building up tension gradually. Keep your jaw and forehead relaxed.  Hold 10 to 20 seconds. Keep your breathing relaxed.  Release the tension slowly. Relax your neck muscles completely before you start the next repetition. Repeat __________ times. Complete this exercise __________ times per day. POSTURE AND BODY MECHANICS CONSIDERATIONS - Cervical Strain and Sprain Keeping correct posture when sitting, standing or completing your activities will reduce the stress put on different body tissues, allowing injured tissues a chance to heal and limiting painful experiences. The following are general guidelines for improved posture. Your physician or physical therapist will provide you with any instructions specific to your needs. While reading these guidelines, remember:  The exercises prescribed by your provider will help you have the flexibility and strength to maintain correct postures.  The correct posture provides the optimal environment for your joints to   work. All of your joints have less wear and tear when properly supported by a spine with good posture. This means you will experience a healthier, less painful body.  Correct posture must be practiced with all of your activities, especially prolonged sitting and standing. Correct posture is as important when doing repetitive low-stress activities (typing) as it is when doing a single heavy-load activity (lifting). PROLONGED STANDING WHILE SLIGHTLY LEANING FORWARD When completing a task that requires you to lean  forward while standing in one place for a long time, place either foot up on a stationary 2- to 4-inch high object to help maintain the best posture. When both feet are on the ground, the low back tends to lose its slight inward curve. If this curve flattens (or becomes too large), then the back and your other joints will experience too much stress, fatigue more quickly, and can cause pain.  RESTING POSITIONS Consider which positions are most painful for you when choosing a resting position. If you have pain with flexion-based activities (sitting, bending, stooping, squatting), choose a position that allows you to rest in a less flexed posture. You would want to avoid curling into a fetal position on your side. If your pain worsens with extension-based activities (prolonged standing, working overhead), avoid resting in an extended position such as sleeping on your stomach. Most people will find more comfort when they rest with their spine in a more neutral position, neither too rounded nor too arched. Lying on a non-sagging bed on your side with a pillow between your knees, or on your back with a pillow under your knees will often provide some relief. Keep in mind, being in any one position for a prolonged period of time, no matter how correct your posture, can still lead to stiffness. WALKING Walk with an upright posture. Your ears, shoulders, and hips should all line up. OFFICE WORK When working at a desk, create an environment that supports good, upright posture. Without extra support, muscles fatigue and lead to excessive strain on joints and other tissues. CHAIR:  A chair should be able to slide under your desk when your back makes contact with the back of the chair. This allows you to work closely.  The chair's height should allow your eyes to be level with the upper part of your monitor and your hands to be slightly lower than your elbows.  Body position:  Your feet should make contact with the  floor. If this is not possible, use a foot rest.  Keep your ears over your shoulders. This will reduce stress on your neck and low back. Document Released: 06/21/2005 Document Revised: 11/05/2013 Document Reviewed: 10/03/2008 [employer] Patient Information 2015 [employer]. This information is not intended to replace advice given to you by your health care provider. Make sure you discuss any questions you have with your health care provider.  

## 2014-12-12 IMAGING — CR DG LUMBAR SPINE COMPLETE 4+V
5 series · 5 of 5 positions shown · non-contrast
Comparison: MRI 06/18/2013

CLINICAL DATA: Back pain.

EXAM:
LUMBAR SPINE - COMPLETE 4+ VIEW

[t l-spine a.p.]
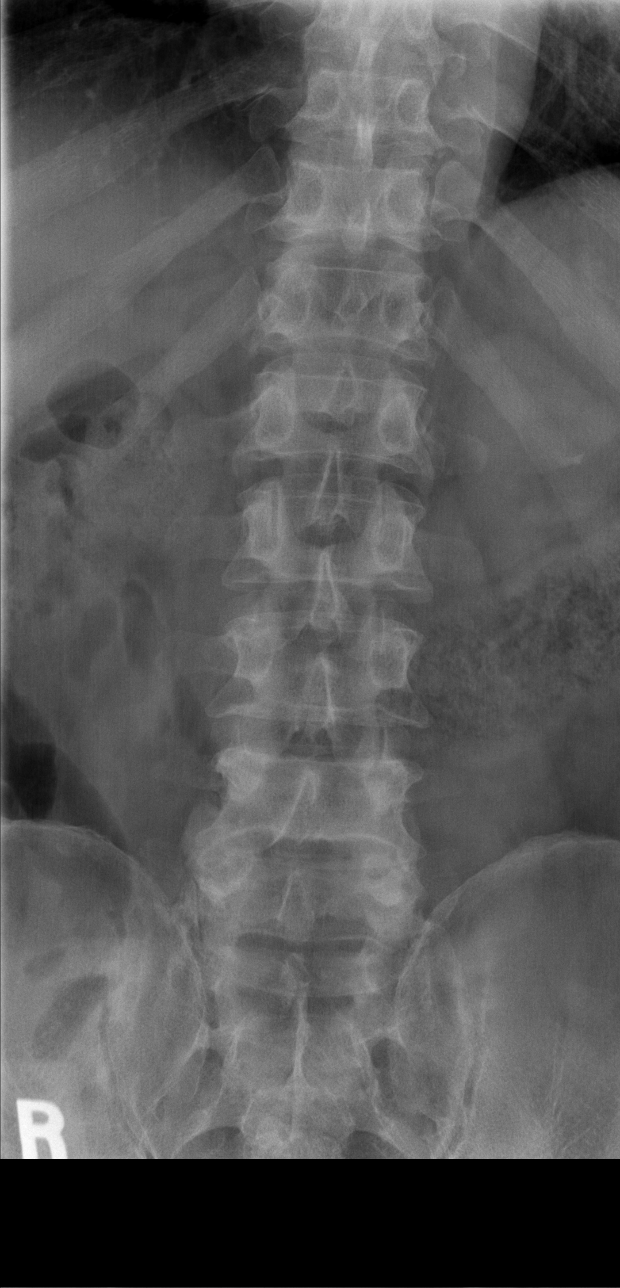

[t l-spine oblique exposure (1 of 2)]
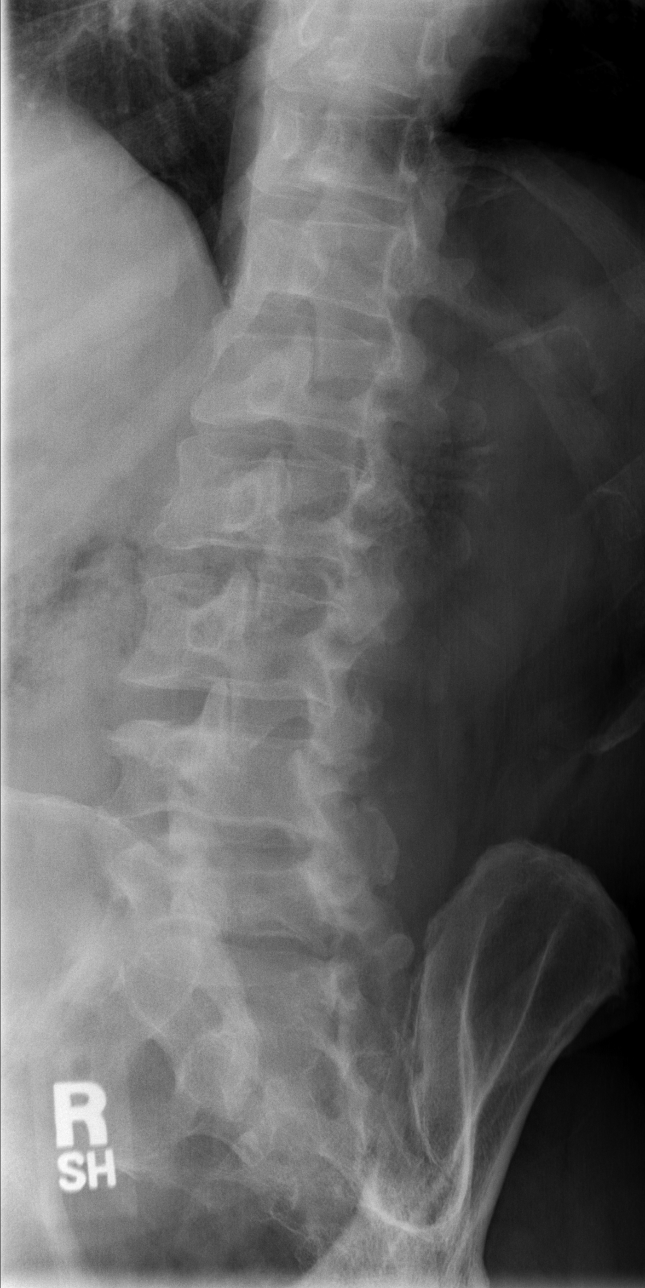

[t l-spine oblique exposure (2 of 2)]
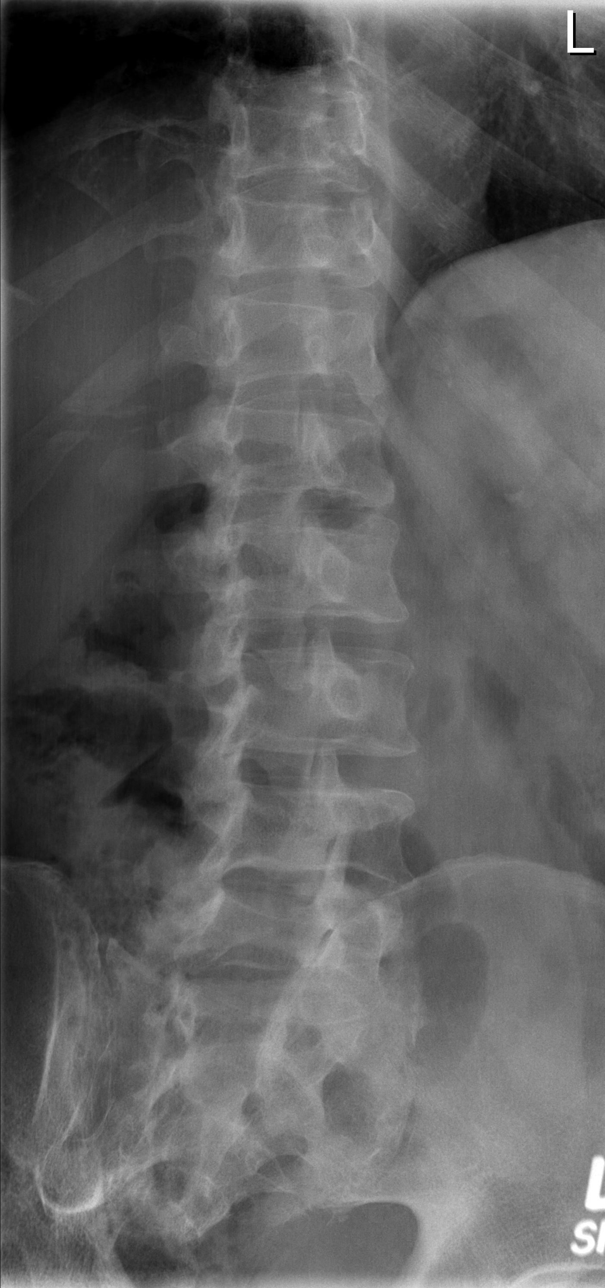

[t l-spine lat]
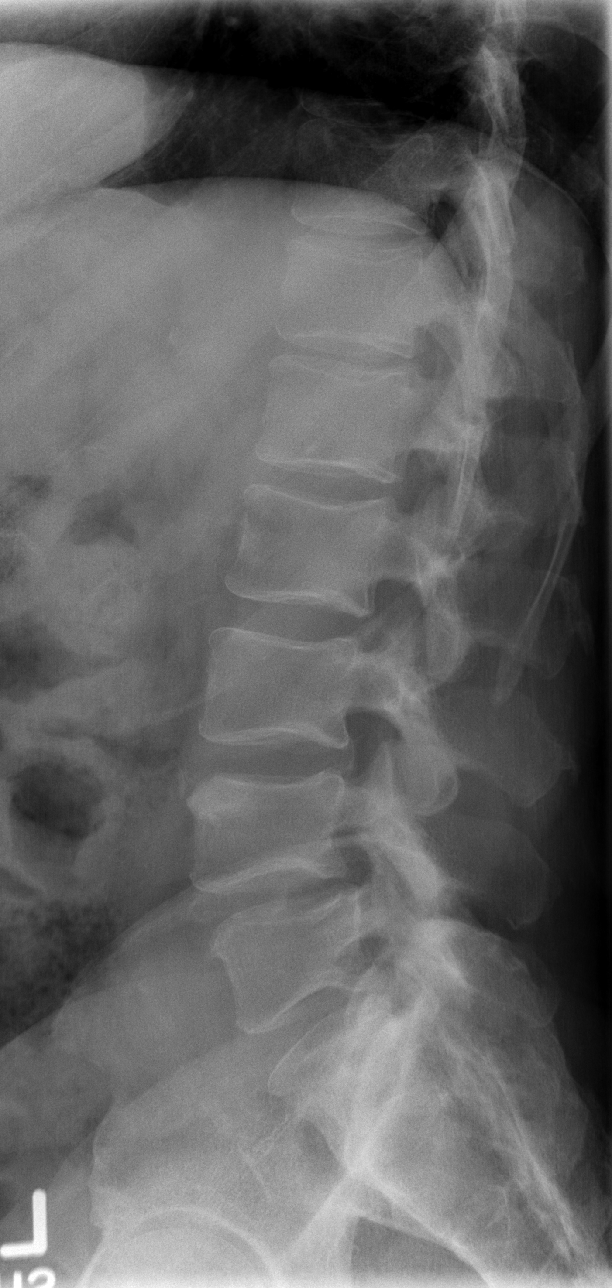

[t l-spine l5-s1 spot]
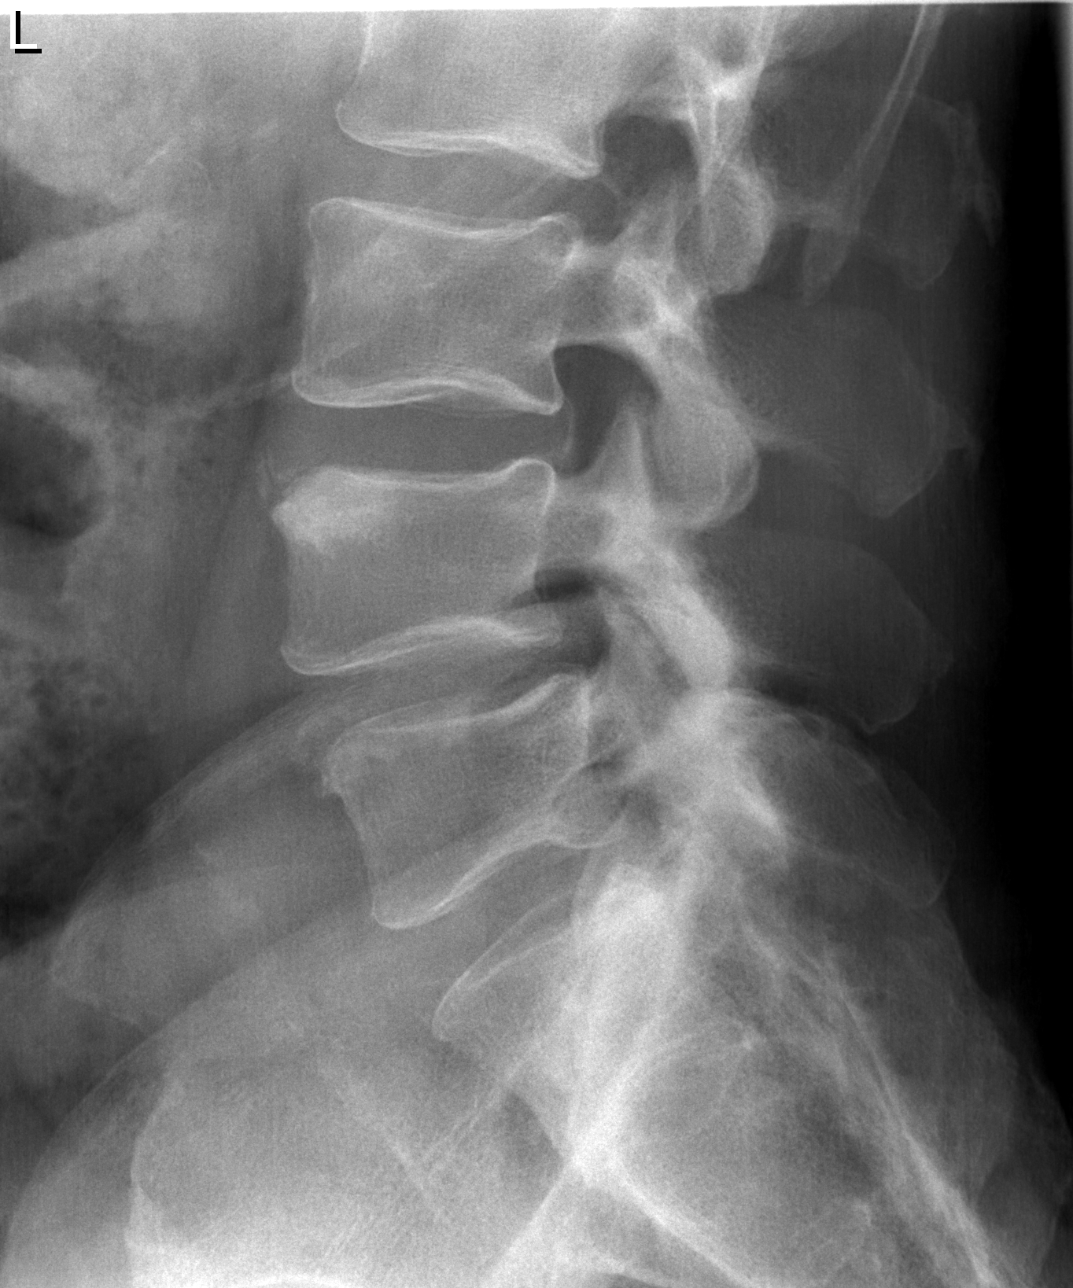

[5 of 5 positions shown; findings below may reference images not displayed]

FINDINGS: Examination demonstrates normal vertebral body heights. There is
mild spondylosis throughout the lumbar spine. There is minimal disc
space narrowing at the L4-5 level. There is a very subtle grade 1
anterolisthesis of L4 on L5 likely due to the moderate facet
arthropathy there is minimal sclerosis over the sacroiliac joints.
IMPRESSION: Mild spondylosis of the lumbar spine with minimal disc disease at
the L4-5 level. Subtle grade 1 anterolisthesis of L4 on L5 due to
facet arthropathy.
